# Patient Record
Sex: Female | Born: 1975 | Race: White | Hispanic: No | Marital: Married | Smoking: Current every day smoker
Health system: Southern US, Community
[De-identification: ages and names within clinical notes are randomized; demographics above are authoritative.]

## PROBLEM LIST (undated history)

## (undated) DIAGNOSIS — N289 Disorder of kidney and ureter, unspecified: Secondary | ICD-10-CM

## (undated) DIAGNOSIS — C801 Malignant (primary) neoplasm, unspecified: Secondary | ICD-10-CM

## (undated) HISTORY — PX: ABDOMINAL HYSTERECTOMY: SHX81

## (undated) HISTORY — PX: NEPHRECTOMY: SHX65

---
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
## 2018-09-15 ENCOUNTER — Encounter (HOSPITAL_BASED_OUTPATIENT_CLINIC_OR_DEPARTMENT_OTHER): Payer: Self-pay | Admitting: Emergency Medicine

## 2018-09-15 ENCOUNTER — Other Ambulatory Visit: Payer: Self-pay

## 2018-09-15 ENCOUNTER — Emergency Department (HOSPITAL_BASED_OUTPATIENT_CLINIC_OR_DEPARTMENT_OTHER)
Admission: EM | Admit: 2018-09-15 | Discharge: 2018-09-15 | Disposition: A | Discharge status: Home / Self Care | Payer: Managed Care, Other (non HMO) | Attending: Emergency Medicine | Admitting: Emergency Medicine

## 2018-09-15 DIAGNOSIS — A599 Trichomoniasis, unspecified: Secondary | ICD-10-CM | POA: Diagnosis not present

## 2018-09-15 DIAGNOSIS — M7989 Other specified soft tissue disorders: Secondary | ICD-10-CM | POA: Diagnosis present

## 2018-09-15 DIAGNOSIS — F17210 Nicotine dependence, cigarettes, uncomplicated: Secondary | ICD-10-CM | POA: Insufficient documentation

## 2018-09-15 HISTORY — DX: Disorder of kidney and ureter, unspecified: N28.9

## 2018-09-15 HISTORY — DX: Malignant (primary) neoplasm, unspecified: C80.1

## 2018-09-15 LAB — URINALYSIS, ROUTINE W REFLEX MICROSCOPIC
Bilirubin Urine: NEGATIVE
Glucose, UA: NEGATIVE mg/dL
Ketones, ur: NEGATIVE mg/dL
Nitrite: NEGATIVE
Protein, ur: NEGATIVE mg/dL
Specific Gravity, Urine: 1.02 (ref 1.005–1.030)
pH: 6.5 (ref 5.0–8.0)

## 2018-09-15 LAB — COMPREHENSIVE METABOLIC PANEL
ALT: 12 U/L (ref 0–44)
AST: 15 U/L (ref 15–41)
Albumin: 3.8 g/dL (ref 3.5–5.0)
Alkaline Phosphatase: 87 U/L (ref 38–126)
Anion gap: 6 (ref 5–15)
BUN: 13 mg/dL (ref 6–20)
CO2: 25 mmol/L (ref 22–32)
Calcium: 8.8 mg/dL — ABNORMAL LOW (ref 8.9–10.3)
Chloride: 106 mmol/L (ref 98–111)
Creatinine, Ser: 0.85 mg/dL (ref 0.44–1.00)
GFR calc Af Amer: >60 mL/min (ref >60)
GFR calc non Af Amer: >60 mL/min (ref >60)
Glucose, Bld: 106 mg/dL — ABNORMAL HIGH (ref 70–99)
Potassium: 3.6 mmol/L (ref 3.5–5.1)
Sodium: 137 mmol/L (ref 135–145)
Total Bilirubin: 0.4 mg/dL (ref 0.3–1.2)
Total Protein: 6.7 g/dL (ref 6.5–8.1)

## 2018-09-15 LAB — CBC WITH DIFFERENTIAL/PLATELET
Abs Immature Granulocytes: 0.05 10*3/uL (ref 0.00–0.07)
Basophils Absolute: 0 10*3/uL (ref 0.0–0.1)
Basophils Relative: 0 %
Eosinophils Absolute: 0.5 10*3/uL (ref 0.0–0.5)
Eosinophils Relative: 4 %
HCT: 38.2 % (ref 36.0–46.0)
Hemoglobin: 12.9 g/dL (ref 12.0–15.0)
Immature Granulocytes: 1 %
Lymphocytes Relative: 33 %
Lymphs Abs: 3.5 10*3/uL (ref 0.7–4.0)
MCH: 33.8 pg (ref 26.0–34.0)
MCHC: 33.8 g/dL (ref 30.0–36.0)
MCV: 100 fL (ref 80.0–100.0)
Monocytes Absolute: 0.8 10*3/uL (ref 0.1–1.0)
Monocytes Relative: 8 %
Neutro Abs: 5.8 10*3/uL (ref 1.7–7.7)
Neutrophils Relative %: 54 %
Platelets: 251 10*3/uL (ref 150–400)
RBC: 3.82 MIL/uL — ABNORMAL LOW (ref 3.87–5.11)
RDW: 12.1 % (ref 11.5–15.5)
WBC: 10.7 10*3/uL — ABNORMAL HIGH (ref 4.0–10.5)
nRBC: 0 % (ref 0.0–0.2)

## 2018-09-15 LAB — URINALYSIS, MICROSCOPIC (REFLEX)

## 2018-09-15 LAB — PREGNANCY, URINE: Preg Test, Ur: NEGATIVE

## 2018-09-15 MED ORDER — LIDOCAINE HCL (PF) 1 % IJ SOLN
INTRAMUSCULAR | Status: AC
  Administered 2018-09-15: 5 mL
  Filled 2018-09-15: qty 5

## 2018-09-15 MED ORDER — AZITHROMYCIN 1 G PO PACK
1.0000 g | PACK | Freq: Once | ORAL | Status: AC
  Administered 2018-09-15: 1 g via ORAL
  Filled 2018-09-15: qty 1

## 2018-09-15 MED ORDER — CEFTRIAXONE SODIUM 250 MG IJ SOLR
250.0000 mg | Freq: Once | INTRAMUSCULAR | Status: AC
  Administered 2018-09-15: 250 mg via INTRAMUSCULAR
  Filled 2018-09-15: qty 250

## 2018-09-15 MED ORDER — METRONIDAZOLE 500 MG PO TABS
2000.0000 mg | ORAL_TABLET | Freq: Once | ORAL | Status: AC
  Administered 2018-09-15: 2000 mg via ORAL
  Filled 2018-09-15: qty 4

## 2018-09-15 NOTE — ED Triage Notes (Signed)
Pt reports bilateral foot swelling x 3 days. Denies SOB. Reports she feels like her clothes are tighter over the past few days.

## 2018-09-16 NOTE — ED Provider Notes (Signed)
[facility] Provider Note   CSN: [id number] Arrival date & time: 09/15/18  1135    History   Chief Complaint Chief Complaint  Patient presents with  . Foot Swelling    HPI Yesenia Ross is a 43 y.o. female.     HPI   43 year old female with a history of renal cancer and nephrectomy in remission presents with concern for leg swelling.  Reports she feels that over the last 3 days she is having 3 swelling of her bilateral lower extremities, lower legs feel tight.  Denies shortness of breath, orthopnea, abdominal pain, nausea, vomiting, chest pain.  Has never experienced symptoms like this before.  Past Medical History:  Diagnosis Date  . Cancer ([place])   . Renal disorder     There are no active problems to display for this patient.   Past Surgical History:  Procedure Laterality Date  . ABDOMINAL HYSTERECTOMY    . NEPHRECTOMY Right      OB History   No obstetric history on file.      Home Medications    Prior to Admission medications   Not on File    Family History No family history on file.  Social History Social History   Tobacco Use  . Smoking status: Current Every Day Smoker  . Smokeless tobacco: Never Used  Substance Use Topics  . Alcohol use: Not Currently  . Drug use: Not on file     Allergies   Sulfa antibiotics   Review of Systems Review of Systems  Constitutional: Negative for fever.  HENT: Negative for sore throat.   Eyes: Negative for visual disturbance.  Respiratory: Negative for cough and shortness of breath.   Cardiovascular: Positive for leg swelling. Negative for chest pain.  Gastrointestinal: Negative for abdominal pain, nausea and vomiting.  Genitourinary: Negative for difficulty urinating.  Musculoskeletal: Positive for myalgias.  Skin: Negative for rash.  Neurological: Negative for syncope, light-headedness and headaches.     Physical Exam Updated Vital Signs BP 113/71 (BP Location:  Right Arm)   Pulse 72   Temp 97.9 F (36.6 C) (Oral)   Resp 18   Ht 5' (1.524 m)   Wt 90.3 kg   SpO2 98%   BMI 38.86 kg/m   Physical Exam Vitals signs and nursing note reviewed.  Constitutional:      General: She is not in acute distress.    Appearance: She is well-developed. She is not diaphoretic.  HENT:     Head: Normocephalic and atraumatic.  Eyes:     Conjunctiva/sclera: Conjunctivae normal.  Neck:     Musculoskeletal: Normal range of motion.  Cardiovascular:     Rate and Rhythm: Normal rate and regular rhythm.  Pulmonary:     Effort: Pulmonary effort is normal. No respiratory distress.  Abdominal:     General: There is no distension.     Palpations: Abdomen is soft.     Tenderness: There is no abdominal tenderness. There is no guarding.  Musculoskeletal:        General: No tenderness.     Right lower leg: Edema (trace) present.     Left lower leg: Edema (trace) present.  Skin:    General: Skin is warm and dry.     Findings: No erythema or rash.  Neurological:     Mental Status: She is alert and oriented to person, place, and time.      ED Treatments / Results  Labs (all labs ordered are listed,  but only abnormal results are displayed) Labs Reviewed  CBC WITH DIFFERENTIAL/PLATELET - Abnormal; Notable for the following components:      Result Value   WBC 10.7 (*)    RBC 3.82 (*)    All other components within normal limits  COMPREHENSIVE METABOLIC PANEL - Abnormal; Notable for the following components:   Glucose, Bld 106 (*)    Calcium 8.8 (*)    All other components within normal limits  URINALYSIS, ROUTINE W REFLEX MICROSCOPIC - Abnormal; Notable for the following components:   Hgb urine dipstick TRACE (*)    Leukocytes,Ua SMALL (*)    All other components within normal limits  URINALYSIS, MICROSCOPIC (REFLEX) - Abnormal; Notable for the following components:   Bacteria, UA RARE (*)    Trichomonas, UA PRESENT (*)    All other components within  normal limits  PREGNANCY, URINE    EKG None  Radiology No results found.  Procedures Procedures (including critical care time)  Medications Ordered in ED Medications  metroNIDAZOLE (FLAGYL) tablet 2,000 mg (2,000 mg Oral Given 09/15/18 1407)  azithromycin (ZITHROMAX) powder 1 g (1 g Oral Given 09/15/18 1409)  cefTRIAXone (ROCEPHIN) injection 250 mg (250 mg Intramuscular Given 09/15/18 1409)  lidocaine (PF) (XYLOCAINE) 1 % injection (5 mLs  Given 09/15/18 1409)     Initial Impression / Assessment and Plan / ED Course  I have reviewed the triage vital signs and the nursing notes.  Pertinent labs & imaging results that were available during my care of the patient were reviewed by me and considered in my medical decision making (see chart for details).        43 year old female with a history of renal cancer and nephrectomy in remission presents with concern for leg swelling.  Doubt PE DVT given bilateral symptoms.  Strong pulses bilaterally and no sign of arterial occlusion.  No sign of cellulitis on exam.  Labs obtained show normal renal function, normal albumin, and no sign of proteinuria on urinalysis.  She does not have shortness of breath to suggest severe acute congestive heart failure exacerbation that would necessitate admission.    Recommend follow up with PCP, consider outpt ECHO and thyroid testing.  Of note, UA shows trichomonas. Given flagyl and empirically rocephin/azithromycin. Patient discharged in stable condition with understanding of reasons to return.   Final Clinical Impressions(s) / ED Diagnoses   Final diagnoses:  Leg swelling  Trichomonas infection    ED Discharge Orders    None       Gareth Morgan, MD 09/17/18 [phone]

## 2019-12-20 ENCOUNTER — Encounter (HOSPITAL_BASED_OUTPATIENT_CLINIC_OR_DEPARTMENT_OTHER): Payer: Self-pay | Admitting: Emergency Medicine

## 2019-12-20 ENCOUNTER — Other Ambulatory Visit: Payer: Self-pay

## 2019-12-20 ENCOUNTER — Emergency Department (HOSPITAL_BASED_OUTPATIENT_CLINIC_OR_DEPARTMENT_OTHER)
Admission: EM | Admit: 2019-12-20 | Discharge: 2019-12-20 | Disposition: A | Discharge status: Home / Self Care | Payer: Managed Care, Other (non HMO) | Attending: Emergency Medicine | Admitting: Emergency Medicine

## 2019-12-20 DIAGNOSIS — S0502XA Injury of conjunctiva and corneal abrasion without foreign body, left eye, initial encounter: Secondary | ICD-10-CM | POA: Insufficient documentation

## 2019-12-20 DIAGNOSIS — Y998 Other external cause status: Secondary | ICD-10-CM | POA: Insufficient documentation

## 2019-12-20 DIAGNOSIS — F17200 Nicotine dependence, unspecified, uncomplicated: Secondary | ICD-10-CM | POA: Diagnosis not present

## 2019-12-20 DIAGNOSIS — W268XXA Contact with other sharp object(s), not elsewhere classified, initial encounter: Secondary | ICD-10-CM | POA: Diagnosis not present

## 2019-12-20 DIAGNOSIS — Y939 Activity, unspecified: Secondary | ICD-10-CM | POA: Insufficient documentation

## 2019-12-20 DIAGNOSIS — Y929 Unspecified place or not applicable: Secondary | ICD-10-CM | POA: Diagnosis not present

## 2019-12-20 MED ORDER — FLUORESCEIN SODIUM 1 MG OP STRP
1.0000 | ORAL_STRIP | Freq: Once | OPHTHALMIC | Status: AC
  Administered 2019-12-20: 1 via OPHTHALMIC
  Filled 2019-12-20: qty 1

## 2019-12-20 MED ORDER — OXYCODONE-ACETAMINOPHEN 5-325 MG PO TABS
1.0000 | ORAL_TABLET | Freq: Once | ORAL | Status: AC
  Administered 2019-12-20: 1 via ORAL
  Filled 2019-12-20: qty 1

## 2019-12-20 MED ORDER — TETRACAINE HCL 0.5 % OP SOLN
1.0000 [drp] | Freq: Once | OPHTHALMIC | Status: AC
  Administered 2019-12-20: 1 [drp] via OPHTHALMIC
  Filled 2019-12-20: qty 4

## 2019-12-20 MED ORDER — HYDROCODONE-ACETAMINOPHEN 5-325 MG PO TABS: 1.0000 | ORAL_TABLET | Freq: Four times a day (QID) | ORAL | 0 refills | Status: AC | PRN

## 2019-12-20 MED ORDER — IBUPROFEN 400 MG PO TABS
600.0000 mg | ORAL_TABLET | Freq: Once | ORAL | Status: AC
  Administered 2019-12-20: 600 mg via ORAL
  Filled 2019-12-20: qty 1

## 2019-12-20 MED ORDER — POLYMYXIN B-TRIMETHOPRIM 10000-0.1 UNIT/ML-% OP SOLN: 1.0000 [drp] | OPHTHALMIC | 0 refills | Status: AC

## 2019-12-20 NOTE — ED Provider Notes (Signed)
[facility] Provider Note   CSN: [id number] Arrival date & time: 12/20/19  0058     History Chief Complaint  Patient presents with  . Eye Pain    Yesenia Ross is a 44 y.o. female.  HPI   44 year old female with left eye pain.  Acute onset after she bent forward and struck her eye against a plastic drinking straw.  Severe pain since.  Tearing.  Severe photophobia.  She does not wear corrective lenses.  No other acute complaints.  Past Medical History:  Diagnosis Date  . Cancer ([place])   . Renal disorder     There are no problems to display for this patient.   Past Surgical History:  Procedure Laterality Date  . ABDOMINAL HYSTERECTOMY    . NEPHRECTOMY Right      OB History   No obstetric history on file.     No family history on file.  Social History   Tobacco Use  . Smoking status: Current Every Day Smoker  . Smokeless tobacco: Never Used  Substance Use Topics  . Alcohol use: Not Currently  . Drug use: Not on file    Home Medications Prior to Admission medications   Not on File    Allergies    Sulfa antibiotics  Review of Systems   Review of Systems All systems reviewed and negative, other than as noted in HPI. Physical Exam Updated Vital Signs BP 132/81 (BP Location: Right Arm)   Pulse 74   Temp 98 F (36.7 C) (Oral)   Resp 15   Ht 5' (1.524 m)   Wt 100 kg   SpO2 100%   BMI 43.06 kg/m   Physical Exam Vitals and nursing note reviewed.  Constitutional:      General: She is not in acute distress.    Appearance: She is well-developed.  HENT:     Head: Normocephalic and atraumatic.  Eyes:     General:        Right eye: No discharge.     Conjunctiva/sclera: Conjunctivae normal.      Comments: Pretty large almost square shaped corneal abrasion centered over pupil. Conjunctival injection. Tearing.   Cardiovascular:     Rate and Rhythm: Normal rate and regular rhythm.     Heart sounds: Normal heart  sounds. No murmur heard.  No friction rub. No gallop.   Pulmonary:     Effort: Pulmonary effort is normal. No respiratory distress.     Breath sounds: Normal breath sounds.  Abdominal:     General: There is no distension.     Palpations: Abdomen is soft.     Tenderness: There is no abdominal tenderness.  Musculoskeletal:        General: No tenderness.     Cervical back: Neck supple.  Skin:    General: Skin is warm and dry.  Neurological:     Mental Status: She is alert.  Psychiatric:        Behavior: Behavior normal.        Thought Content: Thought content normal.     ED Results / Procedures / Treatments   Labs (all labs ordered are listed, but only abnormal results are displayed) Labs Reviewed - No data to display  EKG None  Radiology No results found.  Procedures Procedures (including critical care time)  Medications Ordered in ED Medications  fluorescein ophthalmic strip 1 strip (has no administration in time range)  tetracaine (PONTOCAINE) 0.5 % ophthalmic solution 1 drop (1  drop Left Eye Given 12/20/19 0120)    ED Course  I have reviewed the triage vital signs and the nursing notes.  Pertinent labs & imaging results that were available during my care of the patient were reviewed by me and considered in my medical decision making (see chart for details).    MDM Rules/Calculators/A&P                         44 year old female with fairly large corneal abrasion over her left pupil.  As needed pain medication.  Antibiotics.  Given the size and centrally located, will have her follow-up with ophthalmology.  Return precautions discussed.  Final Clinical Impression(s) / ED Diagnoses Final diagnoses:  Abrasion of left cornea, initial encounter    Rx / DC Orders ED Discharge Orders    None       Virgel Manifold, MD 12/22/19 1825

## 2019-12-20 NOTE — ED Triage Notes (Signed)
Patient states she bent down to pick something up and a straw from a cup went into her left eye; states occurred at approx 2100 this pm; states unable to open left eye due to pain.

## 2019-12-20 NOTE — Discharge Instructions (Signed)
Take ibuprofen 600 mg every 6 hours as needed for pain. Vicodin as needed for pain. Antibiotic drops as prescribed.   I expect this to ultimately heal fine but it's a pretty large corneal defect and it's centered right over your pupil. For this reason I think you should also be seen by ophthalmology this upcoming week.

## 2021-04-15 ENCOUNTER — Other Ambulatory Visit: Payer: Self-pay

## 2021-04-15 ENCOUNTER — Ambulatory Visit (HOSPITAL_COMMUNITY)
Admission: RE | Admit: 2021-04-15 | Discharge: 2021-04-15 | Disposition: A | Discharge status: Home / Self Care | Payer: Managed Care, Other (non HMO) | Source: Ambulatory Visit | Attending: Nurse Practitioner | Admitting: Nurse Practitioner

## 2021-04-15 ENCOUNTER — Other Ambulatory Visit (HOSPITAL_COMMUNITY): Payer: Self-pay | Admitting: Nurse Practitioner

## 2021-04-15 DIAGNOSIS — R1032 Left lower quadrant pain: Secondary | ICD-10-CM

## 2021-04-15 MED ORDER — IOHEXOL 350 MG/ML SOLN
100.0000 mL | Freq: Once | INTRAVENOUS | Status: AC | PRN
  Administered 2021-04-15: 100 mL via INTRAVENOUS

## 2023-01-02 IMAGING — CT CT ABD-PELV W/ CM
2 of 5 series · 16 of 46 positions shown, 18 images · IV contrast (Omni 300)
Comparison: 10/28/2019

CLINICAL DATA: Left lower quadrant pain for 1 week.

EXAM:
CT ABDOMEN AND PELVIS WITH CONTRAST
TECHNIQUE: Multidetector CT imaging of the abdomen and pelvis was performed
using the standard protocol following bolus administration of
intravenous contrast.
CONTRAST:  100mL OMNIPAQUE IOHEXOL 350 MG/ML SOLN

[Series 3: a/p w/ 5mm · axial · 0.95mm/px · z∈[+724,+1154]mm · 13 of 98 slices shown, 15 images]
[im 6/98  soft-tissue]
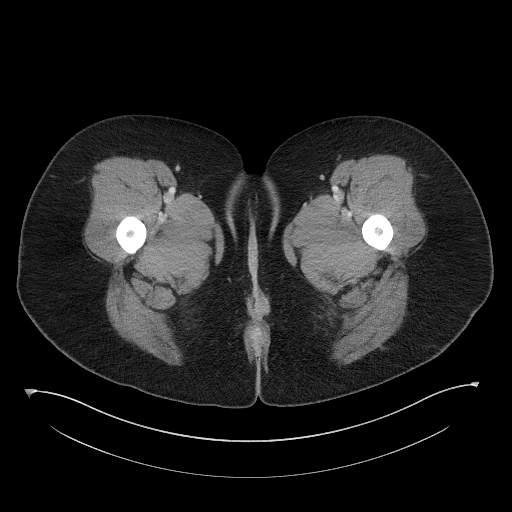
[im 6/98  bone]
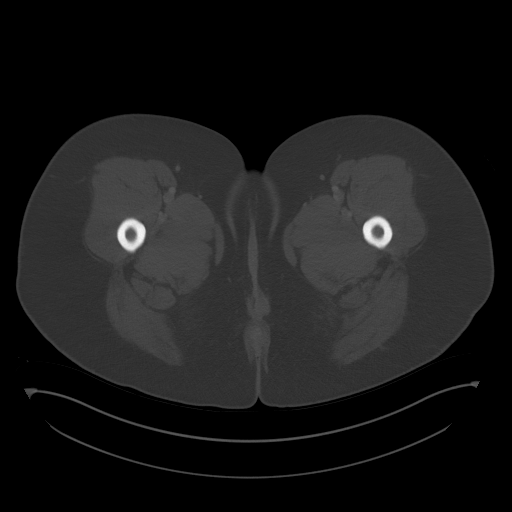
[im 16/98  soft-tissue]
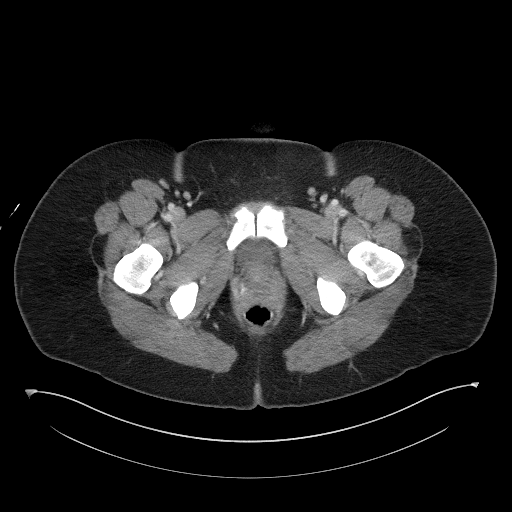
[im 21/98  soft-tissue]
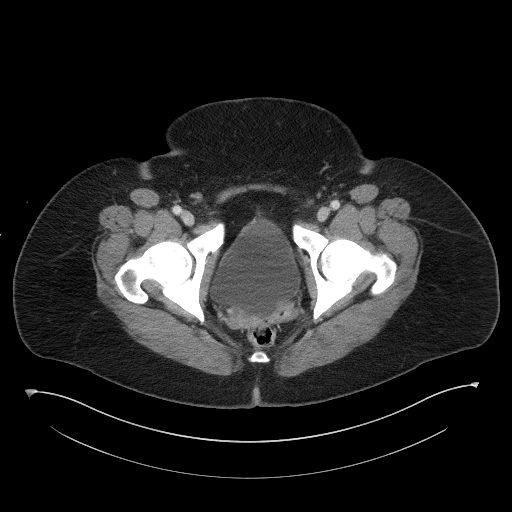
[im 26/98  soft-tissue]
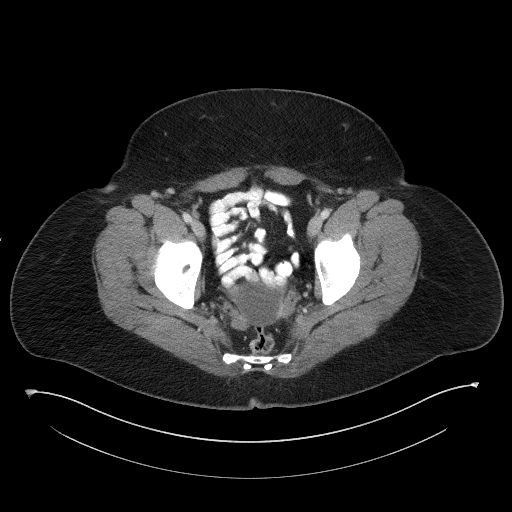
[im 36/98  soft-tissue]
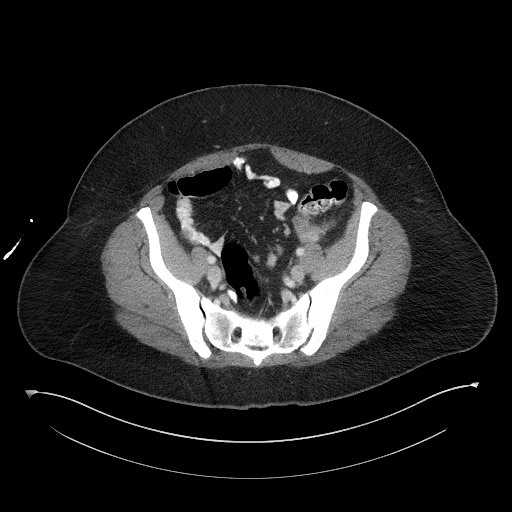
[im 41/98  soft-tissue]
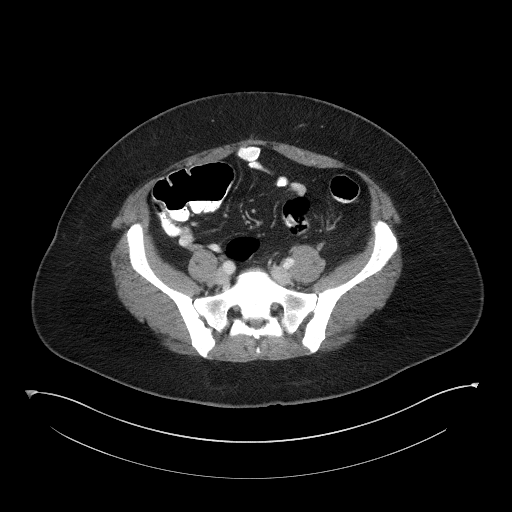
[im 52/98  soft-tissue]
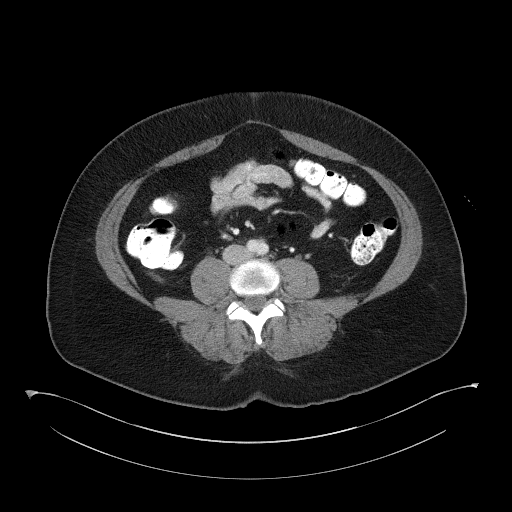
[im 57/98  soft-tissue]
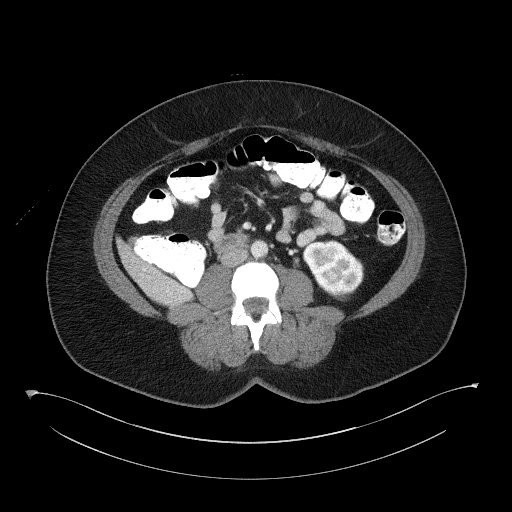
[im 62/98  soft-tissue]
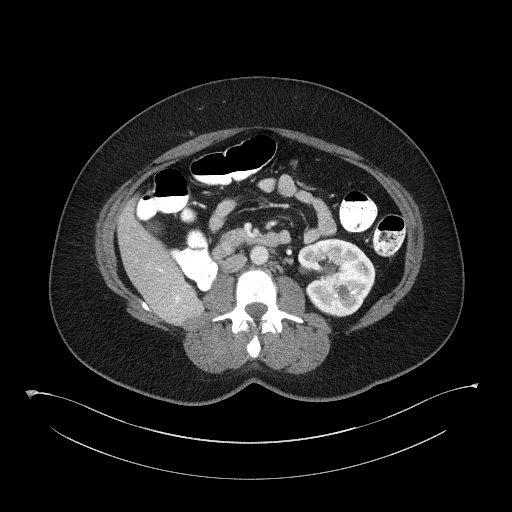
[im 62/98  bone]
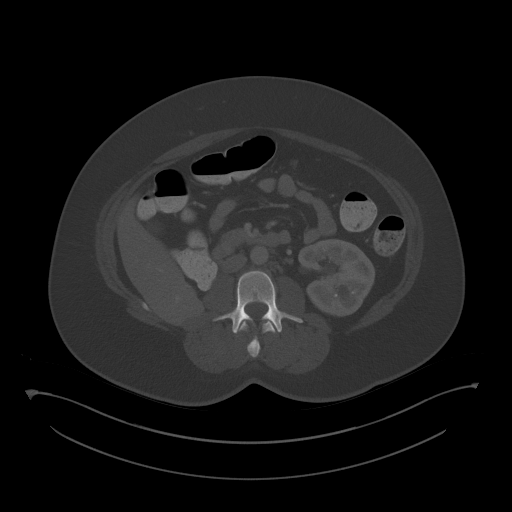
[im 72/98  soft-tissue]
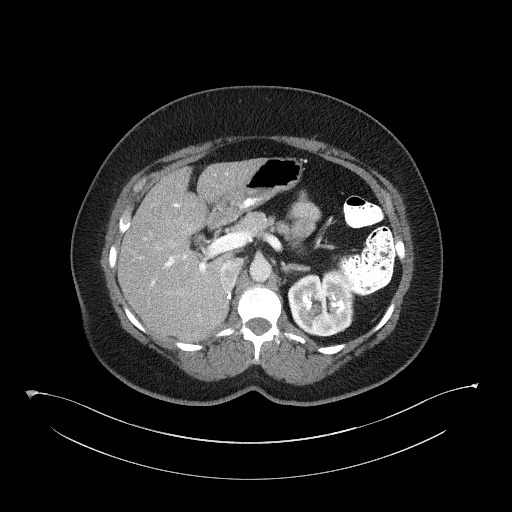
[im 77/98  soft-tissue]
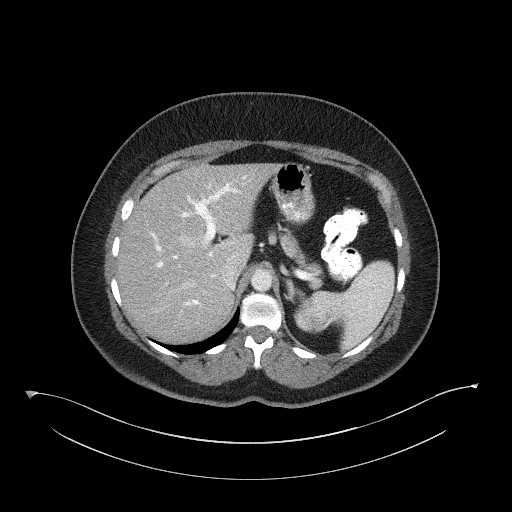
[im 82/98  soft-tissue]
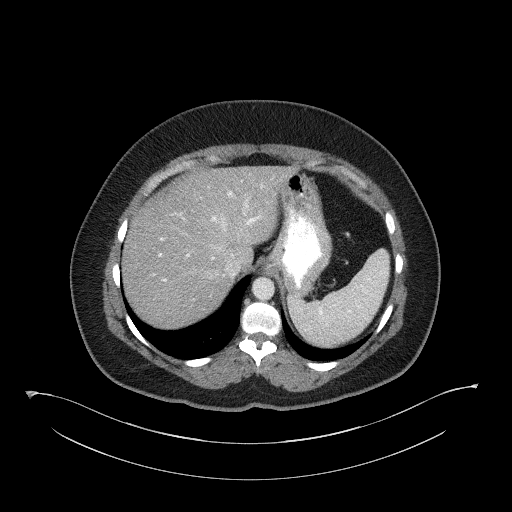
[im 92/98  soft-tissue]
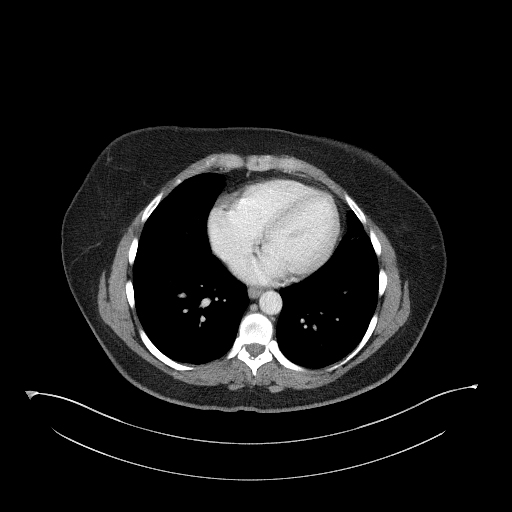

[Series 6: a/p w/ cor · coronal · 0.96mm/px · 3 of 176 slices shown]
[im 59/176  soft-tissue]
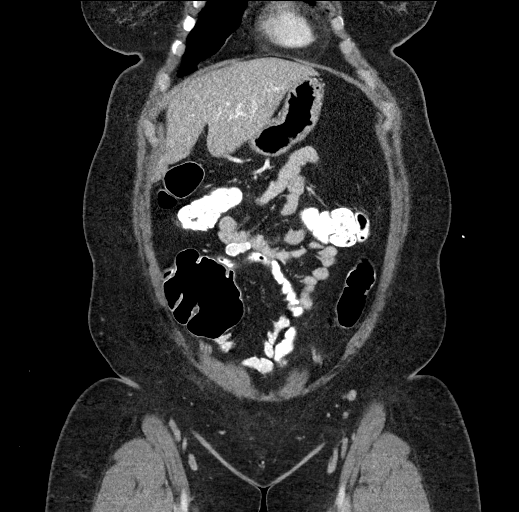
[im 78/176  soft-tissue]
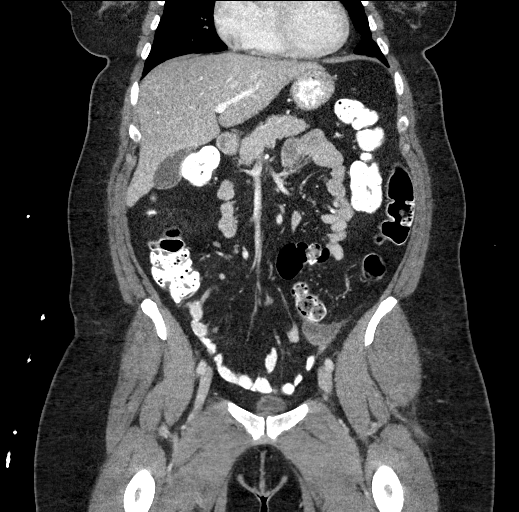
[im 98/176  soft-tissue]
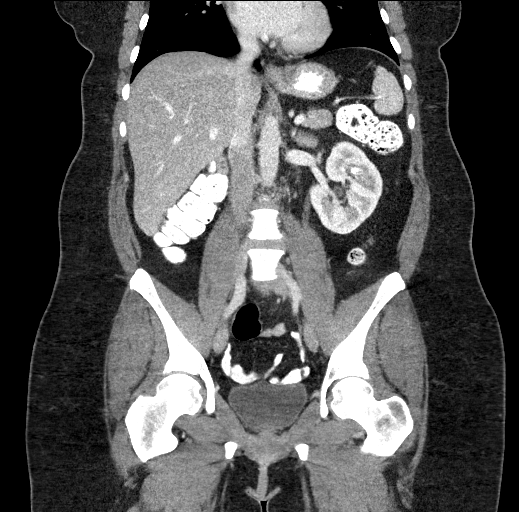

[16 of 46 positions shown; findings below may reference images not displayed]

FINDINGS: Lower chest: No acute abnormality.

Hepatobiliary: Normal appearance of the liver. No gallstones or
signs of gallbladder wall inflammation. No bile duct dilatation.

Pancreas: Unremarkable. No pancreatic ductal dilatation or
surrounding inflammatory changes.

Spleen: Normal in size without focal abnormality.

Adrenals/Urinary Tract: Status post right adrenalectomy and right
nephrectomy. There are several left kidney cysts. The largest is in
the upper pole of the left kidney measuring 2.5 cm. Multiple left
renal calculi are identified involving the upper and lower pole
measuring up to 7 mm. No hydronephrosis, hydroureter or ureteral
calculi. Urinary bladder is unremarkable.

Stomach/Bowel: Stomach appears normal. The appendix is visualized
and is within normal limits. No bowel wall thickening, inflammation,
or distension.

Vascular/Lymphatic: No significant vascular findings are present. No
enlarged abdominal or pelvic lymph nodes.

Reproductive: Hysterectomy. Both ovaries are visualized. There is a
small volume of fluid and soft tissue stranding adjacent to the left
ovary in the left lower quadrant of the abdomen. Findings may
reflect sequelae of ruptured cyst, inflammation/infection. Less
favored but not excluded would be ovarian torsion.

Other: None

Musculoskeletal: No acute or significant osseous findings.
IMPRESSION: 1. There is a small volume of fluid and soft tissue stranding
adjacent to the left ovary in the left lower quadrant of the
abdomen. Findings may reflect sequelae of ruptured ovarian cyst,
inflammation/infection. Less favored, but not excluded, would be
ovarian torsion. Recommend further evaluation with pelvic sonogram
in ovarian Dopplers.
2. Status post right adrenalectomy and right nephrectomy.
3. Left kidney cysts and nonobstructing left renal calculi.
